# Patient Record
Sex: Male | Born: 2005 | Race: Black or African American | Hispanic: No | Marital: Single | Smoking: Never smoker
Health system: Southern US, Community
[De-identification: ages and names within clinical notes are randomized; demographics above are authoritative.]

---
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
## 2024-01-10 ENCOUNTER — Emergency Department (HOSPITAL_BASED_OUTPATIENT_CLINIC_OR_DEPARTMENT_OTHER)
Admission: EM | Admit: 2024-01-10 | Discharge: 2024-01-10 | Disposition: A | Discharge status: Home / Self Care | Attending: Emergency Medicine | Admitting: Emergency Medicine

## 2024-01-10 ENCOUNTER — Encounter (HOSPITAL_BASED_OUTPATIENT_CLINIC_OR_DEPARTMENT_OTHER): Payer: Self-pay | Admitting: Emergency Medicine

## 2024-01-10 ENCOUNTER — Other Ambulatory Visit: Payer: Self-pay

## 2024-01-10 DIAGNOSIS — N50812 Left testicular pain: Secondary | ICD-10-CM | POA: Insufficient documentation

## 2024-01-10 DIAGNOSIS — J45909 Unspecified asthma, uncomplicated: Secondary | ICD-10-CM | POA: Insufficient documentation

## 2024-01-10 NOTE — ED Triage Notes (Signed)
 Pt sts the veins on LT testicle are bigger than ones on the RT; denies pain, but had a dull ache a couple months ago; denies urinary sxs

## 2024-01-10 NOTE — ED Provider Notes (Signed)
 Emergency Department Provider Note   I have reviewed the triage vital signs and the nursing notes.   HISTORY  Chief Complaint Groin Swelling (Testicle)   HPI Glenn Gilbert is a 18 y.o. male past history of asthma presents emergency department with dull, aching discomfort in the left testicle on and off.  Patient states that he is felt to small lump in the testicle for the past several months to even years.  He states he has been very sexually active recently and noticed some occasional discomfort in the testicle.  No severe pain.  No pain currently.  He states that the area he has been feeling feels a little bit more firm and so that prompted him to seek care this evening. No fever.    Past Medical History:  Diagnosis Date   Asthma     Review of Systems  Constitutional: No fever/chills Cardiovascular: Denies chest pain. Respiratory: Denies shortness of breath. Gastrointestinal: No abdominal pain.  No nausea, no vomiting.  Positive left testicle soreness.  Skin: Negative for rash. Neurological: Negative for headaches.  ____________________________________________   PHYSICAL EXAM:  VITAL SIGNS: ED Triage Vitals  Encounter Vitals Group     BP 01/10/24 2057 (!) 150/84     Pulse Rate 01/10/24 2057 69     Resp 01/10/24 2057 20     Temp 01/10/24 2057 98 F (36.7 C)     Temp src --      SpO2 01/10/24 2057 100 %     Weight 01/10/24 2058 165 lb (74.8 kg)     Height 01/10/24 2058 5' 10 (1.778 m)   Constitutional: Alert and oriented. Well appearing and in no acute distress. Eyes: Conjunctivae are normal. Head: Atraumatic. Nose: No congestion/rhinnorhea. Mouth/Throat: Mucous membranes are moist.  Neck: No stridor.   Cardiovascular: Good peripheral circulation.  Respiratory: Normal respiratory effort.  Gastrointestinal: Soft and nontender. No distention.  Genitourinary: Exam performed after obtaining patient's verbal consent.  Normal testicular exam.  No scrotal edema.   I do palpate a spermatic cord on the left testicle when the patient shows me exactly what he is feeling.  No appreciable tenderness. No hernia.  Musculoskeletal: No gross deformities of extremities. Neurologic:  Normal speech and language.  Skin:  Skin is warm, dry and intact. No rash noted.  ____________________________________________   LABS (all labs ordered are listed, but only abnormal results are displayed)  Labs Reviewed  GC/CHLAMYDIA PROBE AMP ([facility]) NOT AT [facility]   ____________________________________________   PROCEDURES  Procedure(s) performed:   Procedures  None  ____________________________________________   INITIAL IMPRESSION / ASSESSMENT AND PLAN / ED COURSE  Pertinent labs & imaging results that were available during my care of the patient were reviewed by me and considered in my medical decision making (see chart for details).   This patient is Presenting for Evaluation of scrotal mass, which does require a range of treatment options, and is a complaint that involves a high risk of morbidity and mortality.  The Differential Diagnoses include scrotal mass, normal anatomy, STI, etc.  Clinical Laboratory Tests Ordered, included STI panel which was sent and results expected in 36-48 hours.   Radiologic Tests: Considered testicle ultrasound but patient has no active pain, tenderness on exam, or other findings to suspect testicular torsion at this time.   Medical Decision Making: Summary:  The patient presents to the emergency department with the left testicle discomfort intermittently over weeks.  My exam demonstrates normal testicle anatomy.  I do not  appreciate any abnormal masses or areas of tenderness to suspect orchitis.  Exceedingly low suspicion for testicular torsion.  Will send a gonorrhea and chlamydia urine screening.  Patient's last STI screening was 2 months prior and normal.    Patient's presentation is most consistent with acute, uncomplicated  illness.   Disposition: discharge  ____________________________________________  FINAL CLINICAL IMPRESSION(S) / ED DIAGNOSES  Final diagnoses:  Pain in left testicle     Note:  This document was prepared using Dragon voice recognition software and may include unintentional dictation errors.  Fonda Law, MD, [facility] Emergency Medicine    Sami Roes, Fonda MATSU, MD 01/10/24 2117

## 2024-01-10 NOTE — Discharge Instructions (Signed)
 We are sending a urine test for sexually transmitted infection screening.  Please follow these results in the MyChart app.  We will call you if you need to return for treatment of an STD.  You may wear supportive underwear and take ibuprofen  if mild discomfort returns.  If you develop severe pain in either testicle he should return to the emergency department immediately for evaluation.
